# Patient Record
Sex: Male | Born: 1965 | Race: White | Hispanic: No | Marital: Married | Smoking: Former smoker
Health system: Southern US, Community
[De-identification: ages and names within clinical notes are randomized; demographics above are authoritative.]

## PROBLEM LIST (undated history)

## (undated) DIAGNOSIS — R011 Cardiac murmur, unspecified: Secondary | ICD-10-CM

## (undated) HISTORY — PX: WISDOM TOOTH EXTRACTION: SHX21

---

## 2016-03-22 ENCOUNTER — Encounter (HOSPITAL_COMMUNITY): Payer: Self-pay | Admitting: Emergency Medicine

## 2016-03-22 ENCOUNTER — Emergency Department (HOSPITAL_COMMUNITY): Payer: 59

## 2016-03-22 ENCOUNTER — Emergency Department (HOSPITAL_COMMUNITY)
Admission: EM | Admit: 2016-03-22 | Discharge: 2016-03-22 | Disposition: A | Discharge status: Home / Self Care | Payer: 59 | Attending: Emergency Medicine | Admitting: Emergency Medicine

## 2016-03-22 DIAGNOSIS — I493 Ventricular premature depolarization: Secondary | ICD-10-CM

## 2016-03-22 DIAGNOSIS — R002 Palpitations: Secondary | ICD-10-CM | POA: Diagnosis present

## 2016-03-22 DIAGNOSIS — Z87891 Personal history of nicotine dependence: Secondary | ICD-10-CM | POA: Insufficient documentation

## 2016-03-22 HISTORY — DX: Cardiac murmur, unspecified: R01.1

## 2016-03-22 LAB — BASIC METABOLIC PANEL
Anion gap: 9 (ref 5–15)
BUN: 11 mg/dL (ref 6–20)
CHLORIDE: 105 mmol/L (ref 101–111)
CO2: 25 mmol/L (ref 22–32)
CREATININE: 0.91 mg/dL (ref 0.61–1.24)
Calcium: 9.1 mg/dL (ref 8.9–10.3)
GFR calc Af Amer: >60 mL/min (ref >60)
GFR calc non Af Amer: >60 mL/min (ref >60)
GLUCOSE: 117 mg/dL — AB (ref 65–99)
POTASSIUM: 3.9 mmol/L (ref 3.5–5.1)
Sodium: 139 mmol/L (ref 135–145)

## 2016-03-22 LAB — CBC
HEMATOCRIT: 41.8 % (ref 39.0–52.0)
Hemoglobin: 14.2 g/dL (ref 13.0–17.0)
MCH: 30.1 pg (ref 26.0–34.0)
MCHC: 34 g/dL (ref 30.0–36.0)
MCV: 88.7 fL (ref 78.0–100.0)
PLATELETS: 141 10*3/uL — AB (ref 150–400)
RBC: 4.71 MIL/uL (ref 4.22–5.81)
RDW: 12.1 % (ref 11.5–15.5)
WBC: 6.8 10*3/uL (ref 4.0–10.5)

## 2016-03-22 LAB — I-STAT TROPONIN, ED: Troponin i, poc: 0 ng/mL (ref 0.00–0.08)

## 2016-03-22 NOTE — ED Triage Notes (Signed)
Per pt, pt coming form his hotel after experiencing a fluttering sensation and feeling like his heart is skipping a beat. Pt rates pain 1/10. Pt states he really does not have pain. Pt states that the sensation is on the left side of his chest. Denies any other associated symptoms. PT denies everyday smoking but states he does has an occasional cigar. Pt states he believes that his MD has told him he has a heart murmur.

## 2016-03-22 NOTE — Discharge Instructions (Signed)
You were seen today for palpitations. Your symptoms seem to correlate with premature ventricular contractions. This can be a result of increased caffeine use, stress, metabolic derangements. You do need follow-up with her primary physician and cardiology when you return home. If you have any new or worsening symptoms she needs to be reevaluated.

## 2016-03-22 NOTE — ED Provider Notes (Signed)
[facility]-EMERGENCY DEPT Provider Note   CSN: [id number] Arrival date & time: 03/22/16  0304   By signing my name below, I, Teofilo PodMatthew P. Jamison, attest that this documentation has been prepared under the direction and in the presence of Shon Batonourtney F Lance Galas, MD . Electronically Signed: Teofilo PodMatthew P. Jamison, ED Scribe. 03/22/2016. 3:37 AM.   History   Chief Complaint Chief Complaint  Patient presents with  . Chest Pain    The history is provided by the patient. No language interpreter was used.   HPI Comments:  David Mercer is a 51 y.o. male with PMHx of a heart murmur who presents to the Emergency Department complaining of intermittent palpitations that began PTA. Pt reports that he was laying in a hotel and felt his heart "fluttering." He notes that he feels like his heart is "skipping a beat" every several seconds. Pt reports that he had a stress test 5 years ago but nothing abnormal was found. He states that he has never felt any similar palpitations before. Pt denies any increased caffeine intake. No alleviating factors noted. Pt denies SOB.    Past Medical History:  Diagnosis Date  . Heart murmur     There are no active problems to display for this patient.   Past Surgical History:  Procedure Laterality Date  . WISDOM TOOTH EXTRACTION         Home Medications    Prior to Admission medications   Not on File    Family History Family History  Problem Relation Age of Onset  . Glaucoma Father     Social History Social History  Substance Use Topics  . Smoking status: Former Games developermoker  . Smokeless tobacco: Never Used  . Alcohol use 0.6 oz/week    1 Cans of beer per week     Allergies   Patient has no known allergies.   Review of Systems Review of Systems  Constitutional: Negative for fever.  Respiratory: Negative for shortness of breath.   Cardiovascular: Positive for palpitations. Negative for chest pain and leg swelling.     Physical Exam Updated Vital  Signs BP (!) 154/91 (BP Location: Right Arm)   Pulse 68   Temp 97.5 F (36.4 C) (Oral)   Resp 19   Ht 5\' 11"  (1.803 m)   Wt 220 lb (99.8 kg)   SpO2 98%   BMI 30.68 kg/m   Physical Exam  Constitutional: He is oriented to person, place, and time. He appears well-developed and well-nourished. No distress.  HENT:  Head: Normocephalic and atraumatic.  Cardiovascular: Normal rate and normal heart sounds.   No murmur heard. Occasionally irregular with PVC  Pulmonary/Chest: Effort normal and breath sounds normal. No respiratory distress. He has no wheezes.  Abdominal: Soft. Bowel sounds are normal. There is no tenderness. There is no rebound.  Musculoskeletal: He exhibits no edema.  Neurological: He is alert and oriented to person, place, and time.  Skin: Skin is warm and dry.  Psychiatric: He has a normal mood and affect.  Nursing note and vitals reviewed.    ED Treatments / Results  DIAGNOSTIC STUDIES:  Oxygen Saturation is 98% on RA, normal by my interpretation.    COORDINATION OF CARE:  3:37 AM Discussed treatment plan with pt at bedside and pt agreed to plan.   Labs (all labs ordered are listed, but only abnormal results are displayed) Labs Reviewed  BASIC METABOLIC PANEL - Abnormal; Notable for the following:       Result Value  Glucose, Bld 117 (*)    All other components within normal limits  CBC - Abnormal; Notable for the following:    Platelets 141 (*)    All other components within normal limits  I-STAT TROPOININ, ED    EKG  EKG Interpretation  Date/Time:  Thursday March 22 2016 03:12:58 EDT Ventricular Rate:  59 PR Interval:    QRS Duration: 83 QT Interval:  397 QTC Calculation: 394 R Axis:   50 Text Interpretation:  Sinus rhythm Probable left atrial enlargement Confirmed by Wilkie Aye  MD, Areebah Meinders ([id number]) on 03/22/2016 4:34:21 AM       Radiology Dg Chest 2 View  Result Date: 03/22/2016 CLINICAL DATA:  Chest palpitations EXAM: CHEST  2 VIEW  COMPARISON:  None. FINDINGS: Cardiomediastinal contours are normal. There is shallow lung inflation without focal consolidation. No pulmonary edema. No pleural effusion or pneumothorax. IMPRESSION: No active cardiopulmonary disease. Electronically Signed   By: Deatra Robinson M.D.   On: 03/22/2016 03:55    Procedures Procedures (including critical care time)  Medications Ordered in ED Medications - No data to display   Initial Impression / Assessment and Plan / ED Course  I have reviewed the triage vital signs and the nursing notes.  Pertinent labs & imaging results that were available during my care of the patient were reviewed by me and considered in my medical decision making (see chart for details).     Patient presents with palpitations. Denies chest pain or shortness breath. He's overall nontoxic. Mildly hypertensive. Initial EKG is reassuring and normal. However, upon my evaluation, he is throwing fairly frequent PVCs on the monitor. As I was discussing this with the patient, he correlates his palpitations with the PVCs on the monitor. Feel this is likely the cause of his symptoms. His lab workup including metabolic panel and troponin is reassuring. On repeat evaluation, his symptoms have improved. He does state that he had decreased caffeine intake but recently had a few more sodas than normal. Feel he is safe for discharge home. Recommend follow-up with his primary physician and cardiology when he returns home.  After history, exam, and medical workup I feel the patient has been appropriately medically screened and is safe for discharge home. Pertinent diagnoses were discussed with the patient. Patient was given return precautions.   Final Clinical Impressions(s) / ED Diagnoses   Final diagnoses:  Palpitations  PVC's (premature ventricular contractions)    New Prescriptions New Prescriptions   No medications on file   I personally performed the services described in this  documentation, which was scribed in my presence. The recorded information has been reviewed and is accurate.     Shon Baton, MD 03/22/16 [phone]

## 2018-04-29 IMAGING — CR DG CHEST 2V
2 series · 2 of 2 positions shown · non-contrast
Comparison: None.

CLINICAL DATA: Chest palpitations

EXAM:
CHEST  2 VIEW

[chest pa]
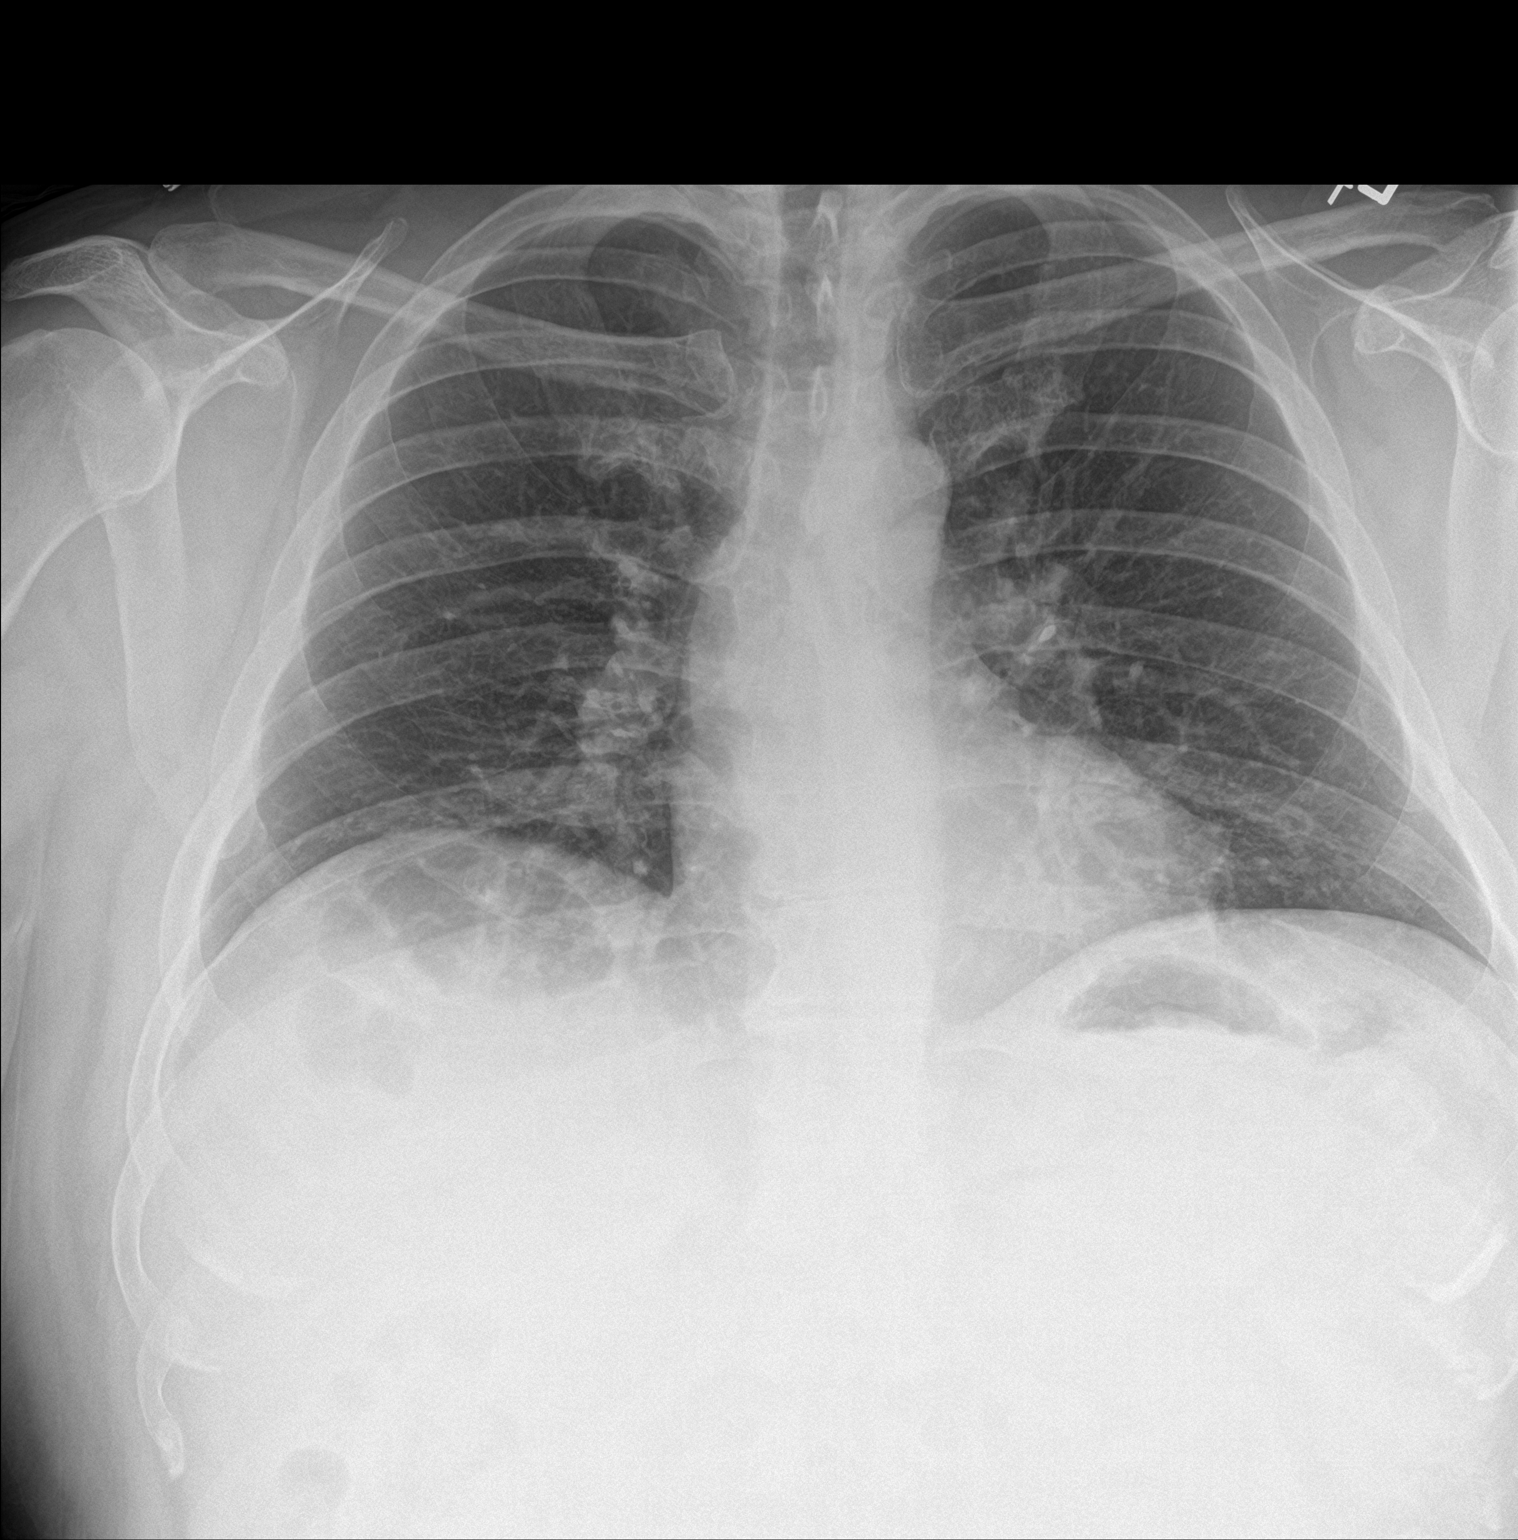

[chest lat]
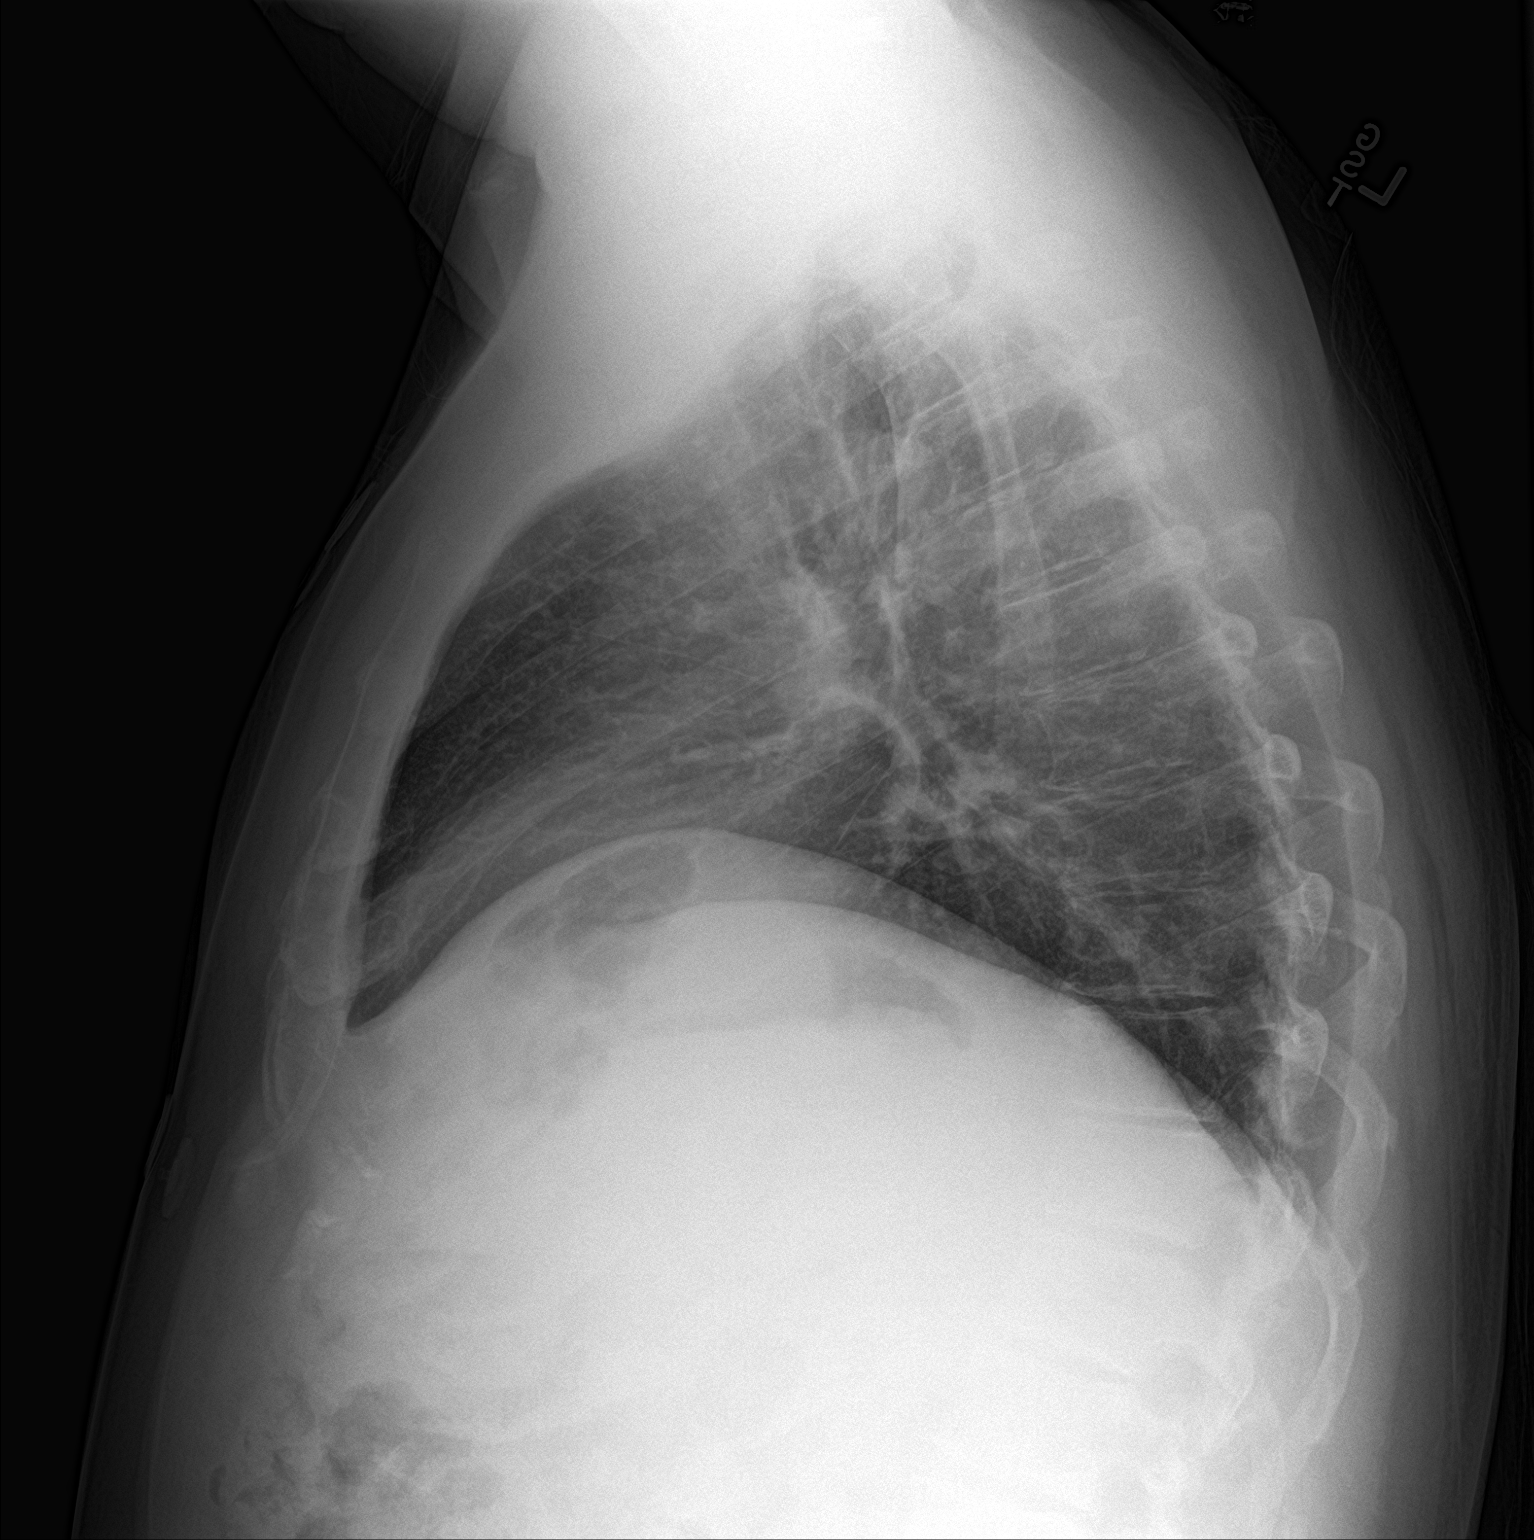

[2 of 2 positions shown; findings below may reference images not displayed]

FINDINGS: Cardiomediastinal contours are normal. There is shallow lung
inflation without focal consolidation. No pulmonary edema. No
pleural effusion or pneumothorax.
IMPRESSION: No active cardiopulmonary disease.
